# Patient Record
Sex: Male | Born: 1975 | Race: White | Hispanic: No | Marital: Married
Health system: Southern US, Community
[De-identification: ages and names within clinical notes are randomized; demographics above are authoritative.]

---

## 2011-11-30 ENCOUNTER — Emergency Department: Payer: Self-pay | Admitting: Unknown Physician Specialty

## 2011-11-30 LAB — BASIC METABOLIC PANEL
Anion Gap: 9 (ref 7–16)
BUN: 10 mg/dL (ref 7–18)
Calcium, Total: 9 mg/dL (ref 8.5–10.1)
Chloride: 103 mmol/L (ref 98–107)
Creatinine: 0.84 mg/dL (ref 0.60–1.30)
EGFR (African American): >60
EGFR (Non-African Amer.): >60
Glucose: 123 mg/dL — ABNORMAL HIGH (ref 65–99)
Osmolality: 271 (ref 275–301)

## 2011-11-30 LAB — CBC WITH DIFFERENTIAL/PLATELET
Basophil #: 0.1 10*3/uL (ref 0.0–0.1)
Basophil %: 0.3 %
Eosinophil #: 0.1 10*3/uL (ref 0.0–0.7)
HGB: 15 g/dL (ref 13.0–18.0)
Lymphocyte #: 2.8 10*3/uL (ref 1.0–3.6)
Lymphocyte %: 12.5 %
MCH: 32.7 pg (ref 26.0–34.0)
MCHC: 33.1 g/dL (ref 32.0–36.0)
Monocyte #: 1.5 x10 3/mm — ABNORMAL HIGH (ref 0.2–1.0)
Monocyte %: 6.6 %
Neutrophil %: 80.3 %
RDW: 13.6 % (ref 11.5–14.5)

## 2011-12-06 LAB — CULTURE, BLOOD (SINGLE)

## 2019-12-29 ENCOUNTER — Emergency Department
Admission: EM | Admit: 2019-12-29 | Discharge: 2019-12-29 | Disposition: A | Discharge status: Home / Self Care | Payer: Self-pay | Attending: Emergency Medicine | Admitting: Emergency Medicine

## 2019-12-29 ENCOUNTER — Encounter: Payer: Self-pay | Admitting: Radiology

## 2019-12-29 ENCOUNTER — Other Ambulatory Visit: Payer: Self-pay

## 2019-12-29 ENCOUNTER — Emergency Department: Payer: Self-pay

## 2019-12-29 DIAGNOSIS — R456 Violent behavior: Secondary | ICD-10-CM | POA: Insufficient documentation

## 2019-12-29 DIAGNOSIS — T1490XA Injury, unspecified, initial encounter: Secondary | ICD-10-CM

## 2019-12-29 DIAGNOSIS — S0181XA Laceration without foreign body of other part of head, initial encounter: Secondary | ICD-10-CM | POA: Insufficient documentation

## 2019-12-29 DIAGNOSIS — Y908 Blood alcohol level of 240 mg/100 ml or more: Secondary | ICD-10-CM | POA: Insufficient documentation

## 2019-12-29 DIAGNOSIS — Z23 Encounter for immunization: Secondary | ICD-10-CM | POA: Insufficient documentation

## 2019-12-29 DIAGNOSIS — Z20822 Contact with and (suspected) exposure to covid-19: Secondary | ICD-10-CM | POA: Insufficient documentation

## 2019-12-29 DIAGNOSIS — S0231XA Fracture of orbital floor, right side, initial encounter for closed fracture: Secondary | ICD-10-CM | POA: Insufficient documentation

## 2019-12-29 DIAGNOSIS — R4182 Altered mental status, unspecified: Secondary | ICD-10-CM | POA: Insufficient documentation

## 2019-12-29 DIAGNOSIS — S0285XA Fracture of orbit, unspecified, initial encounter for closed fracture: Secondary | ICD-10-CM

## 2019-12-29 LAB — SAMPLE TO BLOOD BANK

## 2019-12-29 LAB — BLOOD GAS, VENOUS
Acid-base deficit: 3.2 mmol/L — ABNORMAL HIGH (ref 0.0–2.0)
Bicarbonate: 24 mmol/L (ref 20.0–28.0)
O2 Saturation: 92.8 %
Patient temperature: 37
pCO2, Ven: 50 mmHg (ref 44.0–60.0)
pH, Ven: 7.29 (ref 7.250–7.430)
pO2, Ven: 74 mmHg — ABNORMAL HIGH (ref 32.0–45.0)

## 2019-12-29 LAB — URINALYSIS, COMPLETE (UACMP) WITH MICROSCOPIC
Bacteria, UA: NONE SEEN
Bilirubin Urine: NEGATIVE
Glucose, UA: NEGATIVE mg/dL
Hgb urine dipstick: NEGATIVE
Ketones, ur: NEGATIVE mg/dL
Leukocytes,Ua: NEGATIVE
Nitrite: NEGATIVE
Protein, ur: NEGATIVE mg/dL
Specific Gravity, Urine: 1.003 — ABNORMAL LOW (ref 1.005–1.030)
Squamous Epithelial / HPF: NONE SEEN (ref 0–5)
pH: 7 (ref 5.0–8.0)

## 2019-12-29 LAB — URINE DRUG SCREEN, QUALITATIVE (ARMC ONLY)
Amphetamines, Ur Screen: NOT DETECTED
Barbiturates, Ur Screen: NOT DETECTED
Benzodiazepine, Ur Scrn: NOT DETECTED
Cannabinoid 50 Ng, Ur ~~LOC~~: POSITIVE — AB
Cocaine Metabolite,Ur ~~LOC~~: NOT DETECTED
MDMA (Ecstasy)Ur Screen: NOT DETECTED
Methadone Scn, Ur: NOT DETECTED
Opiate, Ur Screen: NOT DETECTED
Phencyclidine (PCP) Ur S: NOT DETECTED
Tricyclic, Ur Screen: NOT DETECTED

## 2019-12-29 LAB — CBC
HCT: 46.1 % (ref 39.0–52.0)
Hemoglobin: 15.8 g/dL (ref 13.0–17.0)
MCH: 33 pg (ref 26.0–34.0)
MCHC: 34.3 g/dL (ref 30.0–36.0)
MCV: 96.2 fL (ref 80.0–100.0)
Platelets: 442 10*3/uL — ABNORMAL HIGH (ref 150–400)
RBC: 4.79 MIL/uL (ref 4.22–5.81)
RDW: 13.8 % (ref 11.5–15.5)
WBC: 15.6 10*3/uL — ABNORMAL HIGH (ref 4.0–10.5)
nRBC: 0 % (ref 0.0–0.2)

## 2019-12-29 LAB — COMPREHENSIVE METABOLIC PANEL
ALT: 17 U/L (ref 0–44)
AST: 25 U/L (ref 15–41)
Albumin: 4.1 g/dL (ref 3.5–5.0)
Alkaline Phosphatase: 83 U/L (ref 38–126)
Anion gap: 10 (ref 5–15)
BUN: 8 mg/dL (ref 6–20)
CO2: 23 mmol/L (ref 22–32)
Calcium: 8.7 mg/dL — ABNORMAL LOW (ref 8.9–10.3)
Chloride: 111 mmol/L (ref 98–111)
Creatinine, Ser: 0.67 mg/dL (ref 0.61–1.24)
GFR, Estimated: >60 mL/min (ref >60)
Glucose, Bld: 108 mg/dL — ABNORMAL HIGH (ref 70–99)
Potassium: 4.1 mmol/L (ref 3.5–5.1)
Sodium: 144 mmol/L (ref 135–145)
Total Bilirubin: 0.4 mg/dL (ref 0.3–1.2)
Total Protein: 7.3 g/dL (ref 6.5–8.1)

## 2019-12-29 LAB — RESP PANEL BY RT-PCR (FLU A&B, COVID) ARPGX2
Influenza A by PCR: NEGATIVE
Influenza B by PCR: NEGATIVE
SARS Coronavirus 2 by RT PCR: NEGATIVE

## 2019-12-29 LAB — MAGNESIUM: Magnesium: 2.5 mg/dL — ABNORMAL HIGH (ref 1.7–2.4)

## 2019-12-29 LAB — PROTIME-INR
INR: 0.9 (ref 0.8–1.2)
Prothrombin Time: 12 seconds (ref 11.4–15.2)

## 2019-12-29 LAB — TROPONIN I (HIGH SENSITIVITY): Troponin I (High Sensitivity): 4 ng/L (ref <18)

## 2019-12-29 LAB — CKMB (ARMC ONLY): CK, MB: 2.6 ng/mL (ref 0.5–5.0)

## 2019-12-29 LAB — LACTIC ACID, PLASMA
Lactic Acid, Venous: 2.9 mmol/L (ref 0.5–1.9)
Lactic Acid, Venous: 2.3 mmol/L (ref 0.5–1.9)

## 2019-12-29 LAB — ETHANOL: Alcohol, Ethyl (B): 287 mg/dL — ABNORMAL HIGH (ref <10)

## 2019-12-29 MED ORDER — THIAMINE HCL 100 MG PO TABS: 100.0000 mg | ORAL_TABLET | Freq: Every day | ORAL | Status: DC

## 2019-12-29 MED ORDER — LORAZEPAM 2 MG/ML IJ SOLN: 0.0000 mg | Freq: Two times a day (BID) | INTRAMUSCULAR | Status: DC

## 2019-12-29 MED ORDER — KETAMINE HCL 10 MG/ML IJ SOLN
1.0000 mg/kg | Freq: Once | INTRAMUSCULAR | Status: DC
  Administered 2019-12-29: 04:00:00 80 mg via INTRAVENOUS

## 2019-12-29 MED ORDER — THIAMINE HCL 100 MG/ML IJ SOLN
100.0000 mg | Freq: Every day | INTRAMUSCULAR | Status: DC
  Administered 2019-12-29: 10:00:00 100 mg via INTRAVENOUS
  Filled 2019-12-29: qty 2

## 2019-12-29 MED ORDER — LACTATED RINGERS IV BOLUS
1000.0000 mL | Freq: Once | INTRAVENOUS | Status: AC
  Administered 2019-12-29: 03:00:00 1000 mL via INTRAVENOUS

## 2019-12-29 MED ORDER — KETAMINE HCL 10 MG/ML IJ SOLN
INTRAMUSCULAR | Status: AC
  Administered 2019-12-29: 07:00:00 160 mg via INTRAVENOUS
  Filled 2019-12-29: qty 1

## 2019-12-29 MED ORDER — KETAMINE HCL 10 MG/ML IJ SOLN: INTRAMUSCULAR | Status: AC | PRN

## 2019-12-29 MED ORDER — KETAMINE HCL 10 MG/ML IJ SOLN: 2.0000 mg/kg | Freq: Once | INTRAMUSCULAR | Status: AC

## 2019-12-29 MED ORDER — LACTATED RINGERS IV BOLUS
1000.0000 mL | Freq: Once | INTRAVENOUS | Status: AC
  Administered 2019-12-29: 02:00:00 1000 mL via INTRAVENOUS

## 2019-12-29 MED ORDER — ERYTHROMYCIN 5 MG/GM OP OINT
1.0000 "application " | TOPICAL_OINTMENT | Freq: Three times a day (TID) | OPHTHALMIC | Status: DC
  Administered 2019-12-29: 1 via OPHTHALMIC
  Filled 2019-12-29: qty 1

## 2019-12-29 MED ORDER — MIDAZOLAM HCL 2 MG/2ML IJ SOLN
INTRAMUSCULAR | Status: AC
  Filled 2019-12-29: qty 2

## 2019-12-29 MED ORDER — DIPHENHYDRAMINE HCL 50 MG/ML IJ SOLN
50.0000 mg | Freq: Once | INTRAMUSCULAR | Status: AC
  Administered 2019-12-29: 03:00:00 50 mg via INTRAMUSCULAR

## 2019-12-29 MED ORDER — AMOXICILLIN-POT CLAVULANATE 125-31.25 MG/5ML PO SUSR: 125.0000 mg | Freq: Two times a day (BID) | ORAL | 0 refills | Status: AC

## 2019-12-29 MED ORDER — MIDAZOLAM HCL 2 MG/2ML IJ SOLN
2.0000 mg | Freq: Once | INTRAMUSCULAR | Status: AC
  Administered 2019-12-29: 03:00:00 2 mg via INTRAVENOUS

## 2019-12-29 MED ORDER — TETANUS-DIPHTH-ACELL PERTUSSIS 5-2.5-18.5 LF-MCG/0.5 IM SUSY
0.5000 mL | PREFILLED_SYRINGE | Freq: Once | INTRAMUSCULAR | Status: AC
  Administered 2019-12-29: 02:00:00 0.5 mL via INTRAMUSCULAR

## 2019-12-29 MED ORDER — IOHEXOL 350 MG/ML SOLN
125.0000 mL | Freq: Once | INTRAVENOUS | Status: AC | PRN
  Administered 2019-12-29: 03:00:00 125 mL via INTRAVENOUS

## 2019-12-29 MED ORDER — TRAMADOL HCL 50 MG PO TABS: 50.0000 mg | ORAL_TABLET | Freq: Four times a day (QID) | ORAL | 0 refills | Status: AC | PRN

## 2019-12-29 MED ORDER — SODIUM CHLORIDE 0.9 % IV SOLN
3.0000 g | Freq: Once | INTRAVENOUS | Status: AC
  Administered 2019-12-29: 06:00:00 3 g via INTRAVENOUS
  Filled 2019-12-29: qty 8

## 2019-12-29 MED ORDER — LORAZEPAM 2 MG PO TABS: 0.0000 mg | ORAL_TABLET | Freq: Four times a day (QID) | ORAL | Status: DC

## 2019-12-29 MED ORDER — OXYCODONE-ACETAMINOPHEN 5-325 MG PO TABS
1.0000 | ORAL_TABLET | Freq: Once | ORAL | Status: AC
  Administered 2019-12-29: 13:00:00 1 via ORAL
  Filled 2019-12-29: qty 1

## 2019-12-29 MED ORDER — KETAMINE HCL 10 MG/ML IJ SOLN
INTRAMUSCULAR | Status: AC | PRN
  Administered 2019-12-29: 160 mg via INTRAVENOUS

## 2019-12-29 MED ORDER — MIDAZOLAM HCL 2 MG/2ML IJ SOLN
2.0000 mg | Freq: Once | INTRAMUSCULAR | Status: AC
  Administered 2019-12-29: 2 mg via INTRAVENOUS

## 2019-12-29 MED ORDER — LORAZEPAM 2 MG PO TABS: 0.0000 mg | ORAL_TABLET | Freq: Two times a day (BID) | ORAL | Status: DC

## 2019-12-29 MED ORDER — ERYTHROMYCIN 5 MG/GM OP OINT: 1.0000 "application " | TOPICAL_OINTMENT | Freq: Two times a day (BID) | OPHTHALMIC | 0 refills | Status: AC

## 2019-12-29 MED ORDER — DEXMEDETOMIDINE HCL IN NACL 400 MCG/100ML IV SOLN
0.4000 ug/kg/h | INTRAVENOUS | Status: DC
  Administered 2019-12-29: 05:00:00 0.4 ug/kg/h via INTRAVENOUS
  Filled 2019-12-29: qty 100

## 2019-12-29 MED ORDER — KETAMINE HCL 10 MG/ML IJ SOLN
INTRAMUSCULAR | Status: AC | PRN
  Administered 2019-12-29: 50 mg via INTRAVENOUS

## 2019-12-29 MED ORDER — LORAZEPAM 2 MG/ML IJ SOLN: 0.0000 mg | Freq: Four times a day (QID) | INTRAMUSCULAR | Status: DC

## 2019-12-29 MED ORDER — FLUORESCEIN SODIUM 1 MG OP STRP
ORAL_STRIP | OPHTHALMIC | Status: AC
  Filled 2019-12-29: qty 1

## 2019-12-29 MED ORDER — HALOPERIDOL LACTATE 5 MG/ML IJ SOLN
5.0000 mg | Freq: Once | INTRAMUSCULAR | Status: AC
  Administered 2019-12-29: 03:00:00 5 mg via INTRAMUSCULAR

## 2019-12-29 NOTE — ED Provider Notes (Signed)
-----------------------------------------   1:23 PM on 12/29/2019 -----------------------------------------  I took over care on this patient from Dr. Katrinka Blazing.  The patient is now fully alert and oriented.  He appears comfortable.  He is no longer agitated and is behaving appropriately.  His wife is here with him now.  I had an extensive discussion with the patient and his wife about the results of the work-up and imaging.  I also discussed the ophthalmology instructions including the eye patch, oral and topical antibiotics, and ophthalmology follow-up.  Repeat lactate after fluids was still slightly elevated although this is not unexpected given the significant agitation and likely dehydration.  The patient's vital signs have remained stable over the last 12 hours and there is no evidence of sepsis or any acute infectious process.  The patient and his wife reported that he is uninsured and requested some assistance with medications and Medicaid paperwork.  I consulted the social worker who provided the relevant Medicaid paperwork and sent the prescriptions to the medication management clinic.  The patient's concerns have been addressed.  He is stable for discharge at this time.  Return precautions given, and the patient and his wife expressed understanding.   Dionne Bucy, MD 12/29/19 1326

## 2019-12-29 NOTE — ED Notes (Signed)
bair hugger and warm blankets placed on pt.  EKG done

## 2019-12-29 NOTE — ED Notes (Signed)
Verbal order obtained from Dr. Katrinka Blazing to DC restraints.  Restraints removed at this time.

## 2019-12-29 NOTE — ED Notes (Signed)
Patient was given crackers and graham crackers to eat. Patient is requesting pain meds.

## 2019-12-29 NOTE — ED Notes (Signed)
Safety sitter discontinued.

## 2019-12-29 NOTE — ED Notes (Signed)
Pt's wife at bedside.

## 2019-12-29 NOTE — Sedation Documentation (Signed)
This RN, Marlena Clipper, Raquel charge RN, Dr. Katrinka Blazing, two security officers, and Guy tech at bedside at this time.
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
## 2019-12-29 NOTE — Discharge Instructions (Addendum)
Pickup the medications at the [facility] at [facility].   Take the antibiotic as prescribed.  Put the erythromycin ointment on the upper and lower eyelids twice daily for 10 days.  BE VERY CAREFUL AND GENTLE when touching the eyelids.  Wear the eye shield at all times for 1 week.  Follow up with the eye doctor in 2 weeks.  Return to the ER immediately for new, worsening, or persistent severe pain, bleeding or worsened swelling, fever or chills, or any other new or worsening symptoms that concern you.

## 2019-12-29 NOTE — ED Notes (Signed)
Pt back to stretcher from CT bed at this time due to agitation and pt unable to sit still. Restraints applied at this time due to pt swinging hands and arms.

## 2019-12-29 NOTE — ED Notes (Signed)
Patient is resting in a darkened room. Patient is alert. Wife is at bedside.

## 2019-12-29 NOTE — ED Notes (Signed)
Patient was assisted to bedside commode. Patient has a steady gait. Wife at bedside. Paper scrubs and Ed briefs and socks given to patient. Clear, light yellow urine noted in suction cannister.
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
## 2019-12-29 NOTE — ED Notes (Signed)
Pt woke up, asking for the date and where he is, pt oriented and accepting. Rt eye covering had fallen off, Pt informed that it would be placed again, Pt accepting. This tech replaced the eye covering. Pt not combative or aggressive at this time, however, is confused as to why he is here. Pt reoriented and is accepting and resting at this time. This tech introduced self to Pt and Pt informed that this tech will be sitting in the room with him.

## 2019-12-29 NOTE — ED Notes (Signed)
Social Work has been contacted for discharge concerns.

## 2019-12-29 NOTE — ED Notes (Signed)
Patient's wife has left bedside. Patient's wife states he is ready to go home and wants to have a cigarette.

## 2019-12-29 NOTE — Consult Note (Signed)
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 Kidneys enhance symmetrically and uniformly. No concerning mass. No discernible urolithiasis, hydronephrosis. Urinary bladder is unremarkable without discernible bladder injury. Stomach/Bowel: Severely limited assessment of the bowel and mesentery. No focal dilatation or thickening of the large or small bowel. Normal appendix. Assessment the mesentery is essentially precluded. Vascular/Lymphatic: No discernible vascular injuries in the abdomen or pelvis. No clear sites of active contrast extravasation. Reproductive: The prostate and  seminal vesicles are unremarkable. Other: No abdominopelvic free air or fluid is clearly evident. No large body wall hematoma. No traumatic abdominal wall dehiscence or bowel containing hernia. Musculoskeletal: Severely limited assessment of the osseous structures given motion. No discernible vertebral body fracture or height loss. Pelvis appears grossly congruent. Femoral heads are normally located  though the proximal femora are markedly rotated. IMPRESSION: Severely motion degraded imaging. CT head, maxillofacial, cervical spine: 1. No acute intracranial abnormality. 2. Right periorbital soft tissue swelling and laceration. Comminuted, displaced fracture involving the right inferior orbital floor extending into the inferior aspect of the lamina papyracea including fracture propagation through the right infraorbital foramen and right nasolacrimal duct. There is asymmetric thickening and likely intramuscular hemorrhage of the right inferior rectus with some inferior deviation of the muscle and protrusion infraorbital fat into the superior aspect the right maxillary sinus. Small amount of extraconal gas is noted as well. Recommend clinical assessment for features of orbital entrapment. 3. Fractures extend into the medial and lateral walls of the superior right maxillary sinus and bordering the anterior aspect of the right pterygoid palatine fossa. Pneumatized layering right hemosinus. 4. Extensive carious lesions throughout the maxillary and mandibular dentition which remains with numerous periapical lucencies. Correlate with dental exam 5. Marked cranial rotation noted on the cervical spine imaging with a more normal appearance on the maxillofacial imaging without clear osseous injury at the craniocervical atlantoaxial articulations. No other acute suspicious cervical spine injury within the severe limitations of motion artifact. CT chest, abdomen and pelvis: 1. Air lucency at the right first sternocostal joint may  reflect some normal vacuum phenomenon though could correlate for point tenderness to exclude an acute separation. 2. Wedge-shaped soft tissue in the anterior mediastinum as a lobular contour and is favored to reflect a thymic remnant though small mediastinal hematoma not fully excluded. 3. Right upper extremity cephalic approach peripheral catheter terminates prior to the confluence with the subclavian vein. Consider repositioning. 4. No other acute or significant findings in the chest, abdomen or pelvis within the severe limitations of motion artifact. Critical Value/emergent results were called by telephone at the time of interpretation on 12/29/2019 at 4:11 am to provider [facility] , who verbally acknowledged these results. Electronically Signed   By: Kreg Shropshire M.D.   On: 12/29/2019 04:12      Assessment/Plan: 1 ) Upper and lower full thickness lid lacerations. Repaired. Will almost certainly need secondary repair of the nasolacrimal system non-urgently. 2) Brow laceration repaired. 3) Mild ocular injury. 4) Extensive orbital fractures including large floor fracture. Muscle entrapment unlikely, however there is considerable risk of dysmotility and hypoglobus. Orbital floor fracture will likely need eventual repair.  For now: oral abx as administered.                Erythromycin ophthalmic ointment to upper and lower lids BID for 10 days, taking care to not distract the lids forcefully and disrupt the margin repairs.               FULL TIME eye shield.  Continuous wear day and night for 1 week.                Follow up at the [facility] in 2 weeks or PRN problems.  LOS: 0 days   Galen Manila 12/22/20216:06 AM

## 2019-12-29 NOTE — ED Notes (Signed)
Pt became slightly combative on transfer from stretcher to CT table. IM meds given. After several minutes of pt not staying still, Dr. Katrinka Blazing called and placed verbal order for IV versed. Pt given at this time by this RN.

## 2019-12-29 NOTE — ED Notes (Signed)
pts sheets changed at this time.  Pt continues to stay agitated.  Opthamology at bedside to examine.

## 2019-12-29 NOTE — ED Notes (Signed)
Dr. Katrinka Blazing at bedside. Eye pressure checked.

## 2020-06-09 ENCOUNTER — Emergency Department: Payer: Self-pay

## 2020-06-09 ENCOUNTER — Encounter: Payer: Self-pay | Admitting: Emergency Medicine

## 2020-06-09 ENCOUNTER — Emergency Department
Admission: EM | Admit: 2020-06-09 | Discharge: 2020-06-09 | Disposition: A | Discharge status: Home / Self Care | Payer: Self-pay | Attending: Emergency Medicine | Admitting: Emergency Medicine

## 2020-06-09 ENCOUNTER — Other Ambulatory Visit: Payer: Self-pay

## 2020-06-09 DIAGNOSIS — M5442 Lumbago with sciatica, left side: Secondary | ICD-10-CM | POA: Insufficient documentation

## 2020-06-09 DIAGNOSIS — M79605 Pain in left leg: Secondary | ICD-10-CM | POA: Insufficient documentation

## 2020-06-09 MED ORDER — METHOCARBAMOL 750 MG PO TABS: 750.0000 mg | ORAL_TABLET | Freq: Four times a day (QID) | ORAL | 0 refills | Status: AC | PRN

## 2020-06-09 MED ORDER — ACETAMINOPHEN 325 MG PO TABS
650.0000 mg | ORAL_TABLET | Freq: Once | ORAL | Status: AC
  Administered 2020-06-09: 650 mg via ORAL
  Filled 2020-06-09: qty 2

## 2020-06-09 MED ORDER — KETOROLAC TROMETHAMINE 10 MG PO TABS: 10.0000 mg | ORAL_TABLET | Freq: Four times a day (QID) | ORAL | 0 refills | Status: AC | PRN

## 2020-06-09 MED ORDER — OXYCODONE-ACETAMINOPHEN 5-325 MG PO TABS
1.0000 | ORAL_TABLET | Freq: Once | ORAL | Status: AC
  Administered 2020-06-09: 1 via ORAL
  Filled 2020-06-09: qty 1

## 2020-06-09 MED ORDER — KETOROLAC TROMETHAMINE 60 MG/2ML IM SOLN
30.0000 mg | Freq: Once | INTRAMUSCULAR | Status: AC
  Administered 2020-06-09: 30 mg via INTRAMUSCULAR
  Filled 2020-06-09: qty 2

## 2020-06-09 MED ORDER — METHOCARBAMOL 500 MG PO TABS
750.0000 mg | ORAL_TABLET | Freq: Once | ORAL | Status: AC
  Administered 2020-06-09: 750 mg via ORAL
  Filled 2020-06-09: qty 2

## 2020-06-09 MED ORDER — OXYCODONE-ACETAMINOPHEN 5-325 MG PO TABS: 1.0000 | ORAL_TABLET | Freq: Three times a day (TID) | ORAL | 0 refills | Status: AC | PRN

## 2020-06-09 NOTE — ED Provider Notes (Signed)
[facility] Emergency Department Provider Note  ____________________________________________   Event Date/Time   First MD Initiated Contact with Patient 06/09/20 1533     (approximate)  I have reviewed the triage vital signs and the nursing notes.   HISTORY  Chief Complaint Back Pain (Leg pain/)   HPI Jon Alvarez is a 45 y.o. male who reports to the ER for evaluation of left leg pain.  Patient reports that pain began approximately 5 days ago with pain in the anterior left thigh.  He reports mild associated low back pain but reports the left leg is significantly worse and pain down the back.  He denies any history of similar.  Denies any falls or trauma.  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  He reports that over the last 2 days he has had significant progression of the pain and now has difficulty weightbearing even very short distances in his home.  He denies any fevers, loss of bowel or bladder control, saddle anesthesia or perceived weakness.       History reviewed. No pertinent past medical history.  There are no problems to display for this patient.   History reviewed. No pertinent surgical history.  Prior to Admission medications   Medication Sig Start Date End Date Taking? Authorizing Provider  ketorolac (TORADOL) 10 MG tablet Take 1 tablet (10 mg total) by mouth every 6 (six) hours as needed for up to 5 days. 06/09/20 06/14/20 Yes Hena Ewalt, Ruben Gottron, PA  methocarbamol (ROBAXIN-750) 750 MG tablet Take 1 tablet (750 mg total) by mouth 4 (four) times daily as needed for up to 10 days for muscle spasms. 06/09/20 06/19/20 Yes Adarryl Goldammer, Ruben Gottron, PA  oxyCODONE-acetaminophen (PERCOCET) 5-325 MG tablet Take 1 tablet by mouth every 8 (eight) hours as needed for up to 3 days for severe pain. 06/09/20 06/12/20 Yes Lucy Chris, PA    Allergies Patient has no known allergies.  No family history on file.  Social History    Review of  Systems Constitutional: No fever/chills Eyes: No visual changes. ENT: No sore throat. Cardiovascular: Denies chest pain. Respiratory: Denies shortness of breath. Gastrointestinal: No abdominal pain.  No nausea, no vomiting.  No diarrhea.  No constipation. Genitourinary: Negative for dysuria. Musculoskeletal: +back pain, + left leg pain Skin: Negative for rash. Neurological: Negative for headaches, focal weakness or numbness.  ____________________________________________   PHYSICAL EXAM:  VITAL SIGNS: ED Triage Vitals  Enc Vitals Group     BP 06/09/20 1430 130/83     Pulse Rate 06/09/20 1430 82     Resp 06/09/20 1430 16     Temp 06/09/20 1430 97.9 F (36.6 C)     Temp src --      SpO2 06/09/20 1430 98 %     Weight 06/09/20 1347 176 lb 5.9 oz (80 kg)     Height 06/09/20 1430 5\' 7"  (1.702 m)     Head Circumference --      Peak Flow --      Pain Score 06/09/20 1346 8     Pain Loc --      Pain Edu? --      Excl. in GC? --    Constitutional: Alert and oriented. Well appearing and in no acute distress. Eyes: Conjunctivae are normal. PERRL. EOMI. Head: Atraumatic. Nose: No congestion/rhinnorhea. Mouth/Throat: Mucous membranes are moist.  Neck: No stridor.   Cardiovascular: Normal rate, regular rhythm. Grossly normal heart sounds.  Good peripheral circulation.  Respiratory: Normal respiratory effort.  No retractions. Lungs CTAB. Gastrointestinal: Soft and nontender. No distention. No abdominal bruits. No CVA tenderness. Musculoskeletal: Mild tenderness to palpation of the lumbar spine, specifically in the left paraspinal musculature and into the SI joint.  Positive straight leg raise on the left, negative on the right.  Pain with internal and external rotation of the hip.  5/5 strength in ankle plantarflexion, dorsiflexion, knee flexion extension, hip flexion.  Dorsal pedal pulse 2+ bilaterally.  Site of maximal pain in the left anterior quad is not reproducible to  palpation. Neurologic:  Normal speech and language. No gross focal neurologic deficits are appreciated.  Skin:  Skin is warm, dry and intact. No rash noted. Psychiatric: Mood and affect are normal. Speech and behavior are normal.   ____________________________________________  RADIOLOGY I, Lucy Chris, personally viewed and evaluated these images (plain radiographs) as part of my medical decision making, as well as reviewing the written report by the radiologist.  ED provider interpretation: Arthritic changes noted in the lumbar spine, no acute fracture identified of either lumbar spine or left hip  Official radiology report(s): DG Lumbar Spine 2-3 Views  Result Date: 06/09/2020 CLINICAL DATA:  Low back pain after EXAM: LUMBAR SPINE - 2-3 VIEW COMPARISON:  None. FINDINGS: Frontal, lateral, and spot lumbosacral lateral images were obtained. There are 5 non-rib-bearing lumbar type vertebral bodies. There is no fracture or spondylolisthesis. There is moderate disc space narrowing at T12-L1 and L1-2. There is milder disc space narrowing at L2-3. There are anterior osteophytes T12, L1, L2, and L3. No erosive changes. Foci of aortic atherosclerosis noted. IMPRESSION: Osteoarthritic change at several levels. No fracture or spondylolisthesis. Aortic Atherosclerosis (ICD10-I70.0). Electronically Signed   By: Bretta Bang III M.D.   On: 06/09/2020 16:23   DG Hip Unilat W or Wo Pelvis 2-3 Views Left  Result Date: 06/09/2020 CLINICAL DATA:  Pain EXAM: DG HIP (WITH OR WITHOUT PELVIS) 2-3V LEFT COMPARISON:  None. FINDINGS: Frontal pelvis as well as frontal and lateral left hip images obtained. No fracture or dislocation. Joint spaces appear normal. No erosive changes. IMPRESSION: No fracture or dislocation.  No evident arthropathy. Electronically Signed   By: Bretta Bang III M.D.   On: 06/09/2020 16:21    ____________________________________________   INITIAL IMPRESSION / ASSESSMENT AND  PLAN / ED COURSE  As part of my medical decision making, I reviewed the following data within the electronic MEDICAL RECORD NUMBER Nursing notes reviewed and incorporated, Radiograph reviewed and Notes from prior ED visits        Patient is a 45 yo male who reports to the ER for evaluation of left leg pain with associated low back pain. See HPI for further details. In triage, patient has normal vital signs. He denies fever, urinary or fecal incontinence, saddle anesthesia. Exam as above. XRs negative for acute fracture, suspect likely Given normal neurovascular status as well as absence of red flag symptoms, feel this is likely musculoskeletal in nature. Began with toradol, percocet, robaxin and tylenol. Patient was then able to weight bear around the room. Given no redflag symptoms at this time, will defer further work up for follow up with Neurosurgery. Will d/c with anti-inflammatory, muscle relaxant and short course of narcotics. Patient amenable with plan, return precautions were discussed.       ____________________________________________   FINAL CLINICAL IMPRESSION(S) / ED DIAGNOSES  Final diagnoses:  Left leg pain  Acute left-sided low back pain with left-sided sciatica  ED Discharge Orders         Ordered    ketorolac (TORADOL) 10 MG tablet  Every 6 hours PRN        06/09/20 1816    methocarbamol (ROBAXIN-750) 750 MG tablet  4 times daily PRN        06/09/20 1816    oxyCODONE-acetaminophen (PERCOCET) 5-325 MG tablet  Every 8 hours PRN        06/09/20 1816           Note:  This document was prepared using Dragon voice recognition software and may include unintentional dictation errors.   Lucy Chris, PA 06/09/20 1852    Gilles Chiquito, MD 06/09/20 [phone]

## 2020-06-09 NOTE — ED Triage Notes (Signed)
C/O left leg and left lower back pain x 5 days.

## 2020-06-09 NOTE — Discharge Instructions (Addendum)
Take medications as prescribed. You may take an additional 500mg  of Tylenol with the Percocet prescribed. Follow up with neurosurgery or return to ER with any worsening.

## 2020-06-09 NOTE — ED Notes (Signed)
See triage note  Presents with pain to lower back and left leg  States pain started couple of days ago  Denies any injury

## 2021-01-07 DEATH — deceased

## 2022-06-16 IMAGING — DX DG CHEST 1V PORT
1 series · 1 of 1 positions shown · non-contrast
Comparison: None.

CLINICAL DATA: Combative behavior, found unconscious with smell of
alcohol

EXAM:
PORTABLE CHEST 1 VIEW

[chest ap]
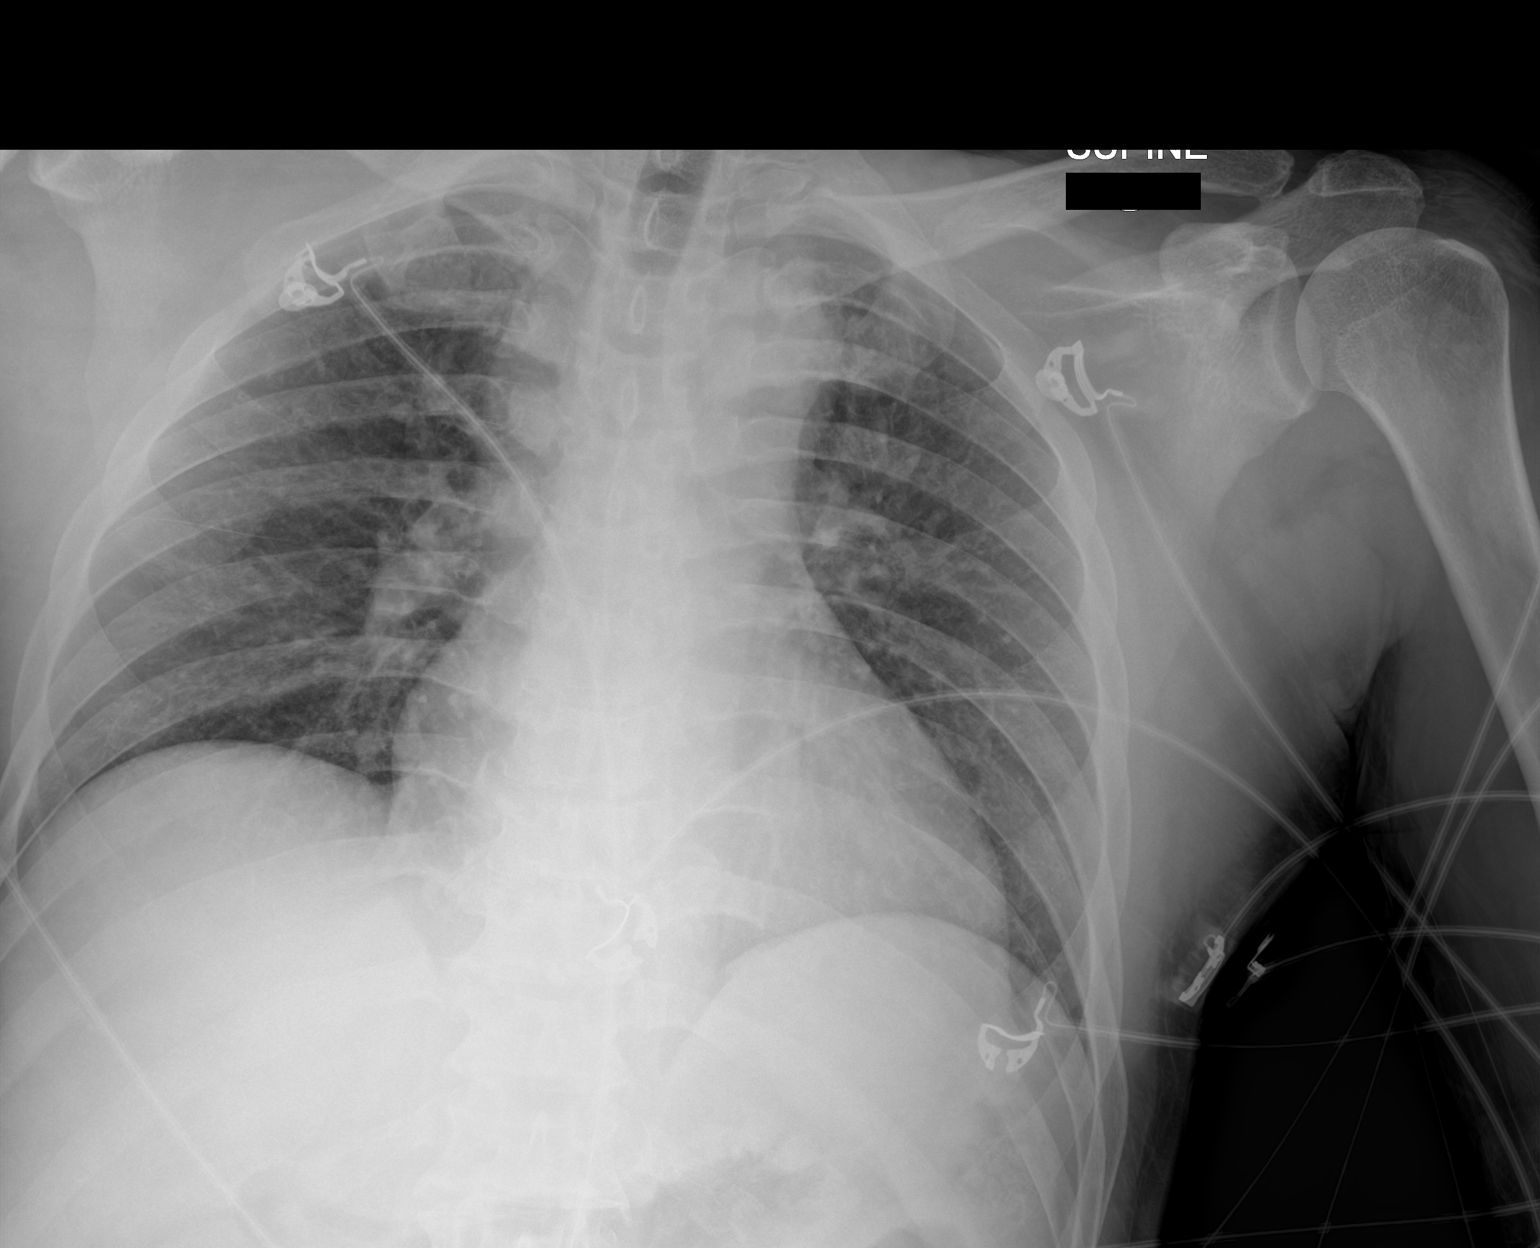

[1 of 1 positions shown; findings below may reference images not displayed]

FINDINGS: Low lung volumes and atelectatic change with vascular crowding. No
consolidation, features of edema, pneumothorax, or effusion.
Pulmonary vascularity is normally distributed. The cardiomediastinal
contours are unremarkable. No acute osseous or soft tissue
abnormality.
IMPRESSION: Low lung volumes and atelectatic change. No other acute
cardiopulmonary abnormality.
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
## 2022-11-26 IMAGING — CR DG HIP (WITH OR WITHOUT PELVIS) 2-3V*L*
1 series · 3 of 3 positions shown · non-contrast
Comparison: None.

CLINICAL DATA: Pain

EXAM:
DG HIP (WITH OR WITHOUT PELVIS) 2-3V LEFT

[Series 1: dg hip unilat w or w/o pelvis 2-3 views  · non-contrast · 0.14mm/px · 3 of 3 slices shown]
[im 1/3]
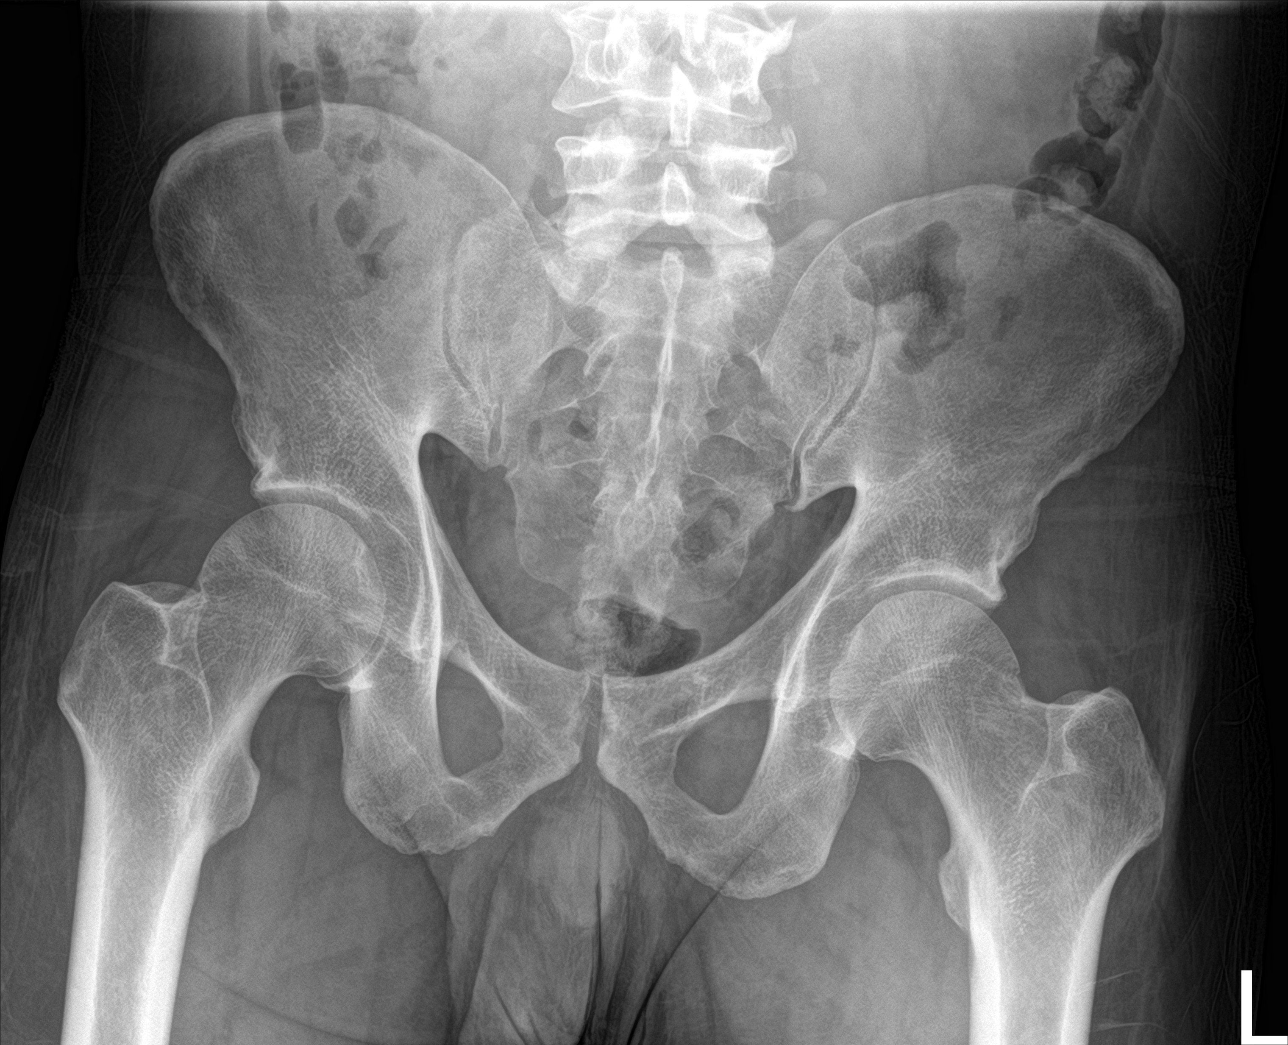
[im 2/3]
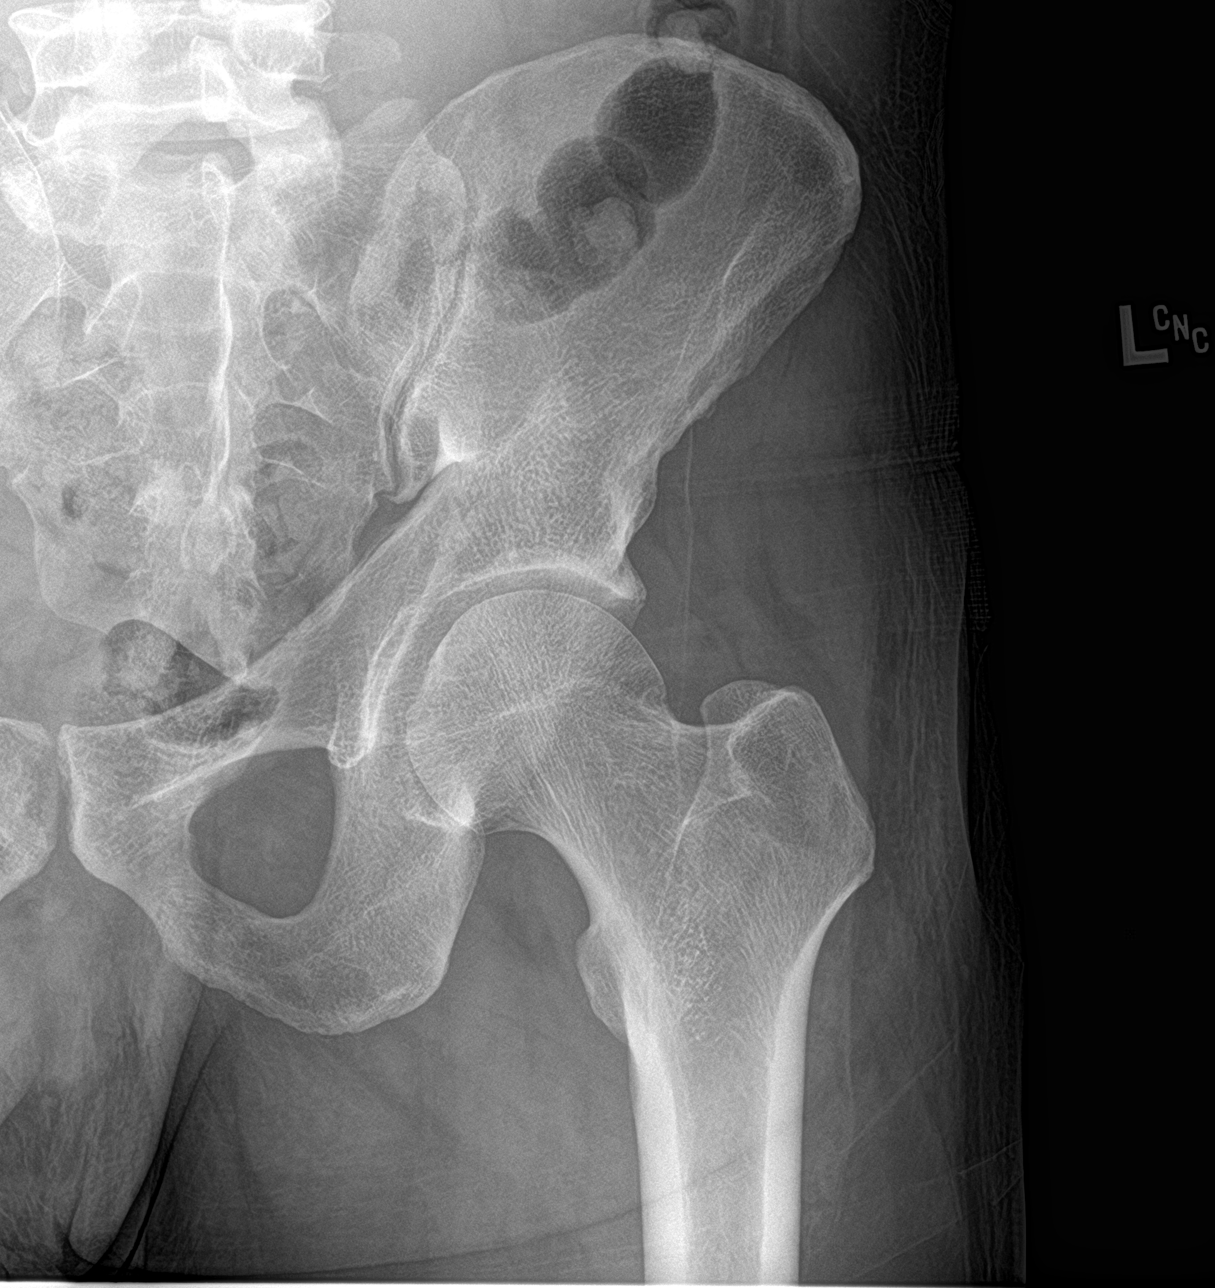
[im 3/3]
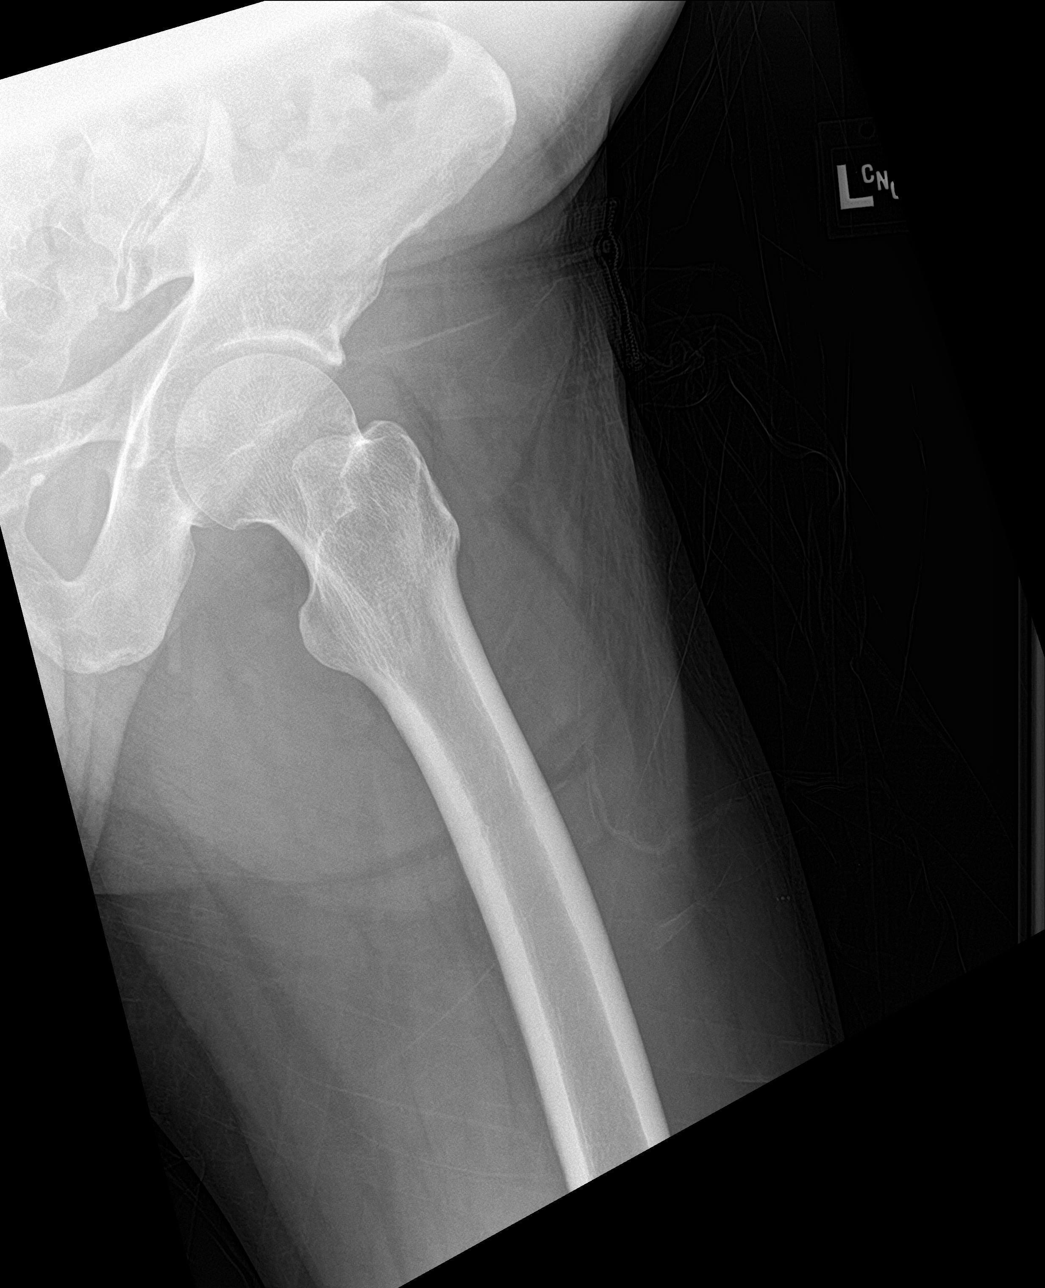

[3 of 3 positions shown; findings below may reference images not displayed]

FINDINGS: Frontal pelvis as well as frontal and lateral left hip images
obtained. No fracture or dislocation. Joint spaces appear normal. No
erosive changes.
IMPRESSION: No fracture or dislocation.  No evident arthropathy.
